# Patient Record
Sex: Female | Born: 1990 | Race: White | Hispanic: No | Marital: Single
Health system: Southern US, Community
[De-identification: ages and names within clinical notes are randomized; demographics above are authoritative.]

---

## 2013-04-06 ENCOUNTER — Emergency Department: Payer: Self-pay | Admitting: Emergency Medicine

## 2013-04-06 LAB — COMPREHENSIVE METABOLIC PANEL
ALK PHOS: 56 U/L
ALT: 19 U/L (ref 12–78)
ANION GAP: 6 — AB (ref 7–16)
Albumin: 3.8 g/dL (ref 3.4–5.0)
BILIRUBIN TOTAL: 0.3 mg/dL (ref 0.2–1.0)
BUN: 7 mg/dL (ref 7–18)
CREATININE: 0.71 mg/dL (ref 0.60–1.30)
Calcium, Total: 9 mg/dL (ref 8.5–10.1)
Chloride: 107 mmol/L (ref 98–107)
Co2: 24 mmol/L (ref 21–32)
GLUCOSE: 84 mg/dL (ref 65–99)
OSMOLALITY: 271 (ref 275–301)
Potassium: 3.4 mmol/L — ABNORMAL LOW (ref 3.5–5.1)
SGOT(AST): 25 U/L (ref 15–37)
Sodium: 137 mmol/L (ref 136–145)
Total Protein: 7.6 g/dL (ref 6.4–8.2)

## 2013-04-06 LAB — CBC
HCT: 40.2 % (ref 35.0–47.0)
HGB: 13.8 g/dL (ref 12.0–16.0)
MCH: 30.7 pg (ref 26.0–34.0)
MCHC: 34.2 g/dL (ref 32.0–36.0)
MCV: 90 fL (ref 80–100)
Platelet: 153 10*3/uL (ref 150–440)
RBC: 4.48 10*6/uL (ref 3.80–5.20)
RDW: 12.5 % (ref 11.5–14.5)
WBC: 12 10*3/uL — ABNORMAL HIGH (ref 3.6–11.0)

## 2013-04-06 LAB — URINALYSIS, COMPLETE
Bilirubin,UR: NEGATIVE
Glucose,UR: NEGATIVE mg/dL (ref 0–75)
Leukocyte Esterase: NEGATIVE
Nitrite: NEGATIVE
PROTEIN: NEGATIVE
Ph: 7 (ref 4.5–8.0)
Specific Gravity: 1.01 (ref 1.003–1.030)
Squamous Epithelial: 1
WBC UR: <1 /HPF (ref 0–5)

## 2013-04-06 LAB — LIPASE, BLOOD: Lipase: 89 U/L (ref 73–393)

## 2013-04-07 ENCOUNTER — Inpatient Hospital Stay: Payer: Self-pay | Admitting: Internal Medicine

## 2013-04-07 LAB — COMPREHENSIVE METABOLIC PANEL
Albumin: 3 g/dL — ABNORMAL LOW (ref 3.4–5.0)
Alkaline Phosphatase: 46 U/L
Anion Gap: 7 (ref 7–16)
BUN: 4 mg/dL — ABNORMAL LOW (ref 7–18)
Bilirubin,Total: 0.3 mg/dL (ref 0.2–1.0)
CHLORIDE: 107 mmol/L (ref 98–107)
CREATININE: 0.59 mg/dL — AB (ref 0.60–1.30)
Calcium, Total: 7.5 mg/dL — ABNORMAL LOW (ref 8.5–10.1)
Co2: 23 mmol/L (ref 21–32)
EGFR (Non-African Amer.): >60
GLUCOSE: 123 mg/dL — AB (ref 65–99)
OSMOLALITY: 272 (ref 275–301)
Potassium: 3.3 mmol/L — ABNORMAL LOW (ref 3.5–5.1)
SGOT(AST): 23 U/L (ref 15–37)
SGPT (ALT): 19 U/L (ref 12–78)
Sodium: 137 mmol/L (ref 136–145)
Total Protein: 6.2 g/dL — ABNORMAL LOW (ref 6.4–8.2)

## 2013-04-07 LAB — CBC WITH DIFFERENTIAL/PLATELET
BASOS ABS: 0 10*3/uL (ref 0.0–0.1)
Basophil %: 0.1 %
EOS ABS: 0 10*3/uL (ref 0.0–0.7)
Eosinophil %: 0 %
HCT: 34.5 % — ABNORMAL LOW (ref 35.0–47.0)
HGB: 11.7 g/dL — ABNORMAL LOW (ref 12.0–16.0)
Lymphocyte #: 0.8 10*3/uL — ABNORMAL LOW (ref 1.0–3.6)
Lymphocyte %: 7.7 %
MCH: 30.4 pg (ref 26.0–34.0)
MCHC: 33.9 g/dL (ref 32.0–36.0)
MCV: 90 fL (ref 80–100)
Monocyte #: 1.1 x10 3/mm — ABNORMAL HIGH (ref 0.2–0.9)
Monocyte %: 10.2 %
NEUTROS PCT: 82 %
Neutrophil #: 8.9 10*3/uL — ABNORMAL HIGH (ref 1.4–6.5)
PLATELETS: 147 10*3/uL — AB (ref 150–440)
RBC: 3.85 10*6/uL (ref 3.80–5.20)
RDW: 12.5 % (ref 11.5–14.5)
WBC: 10.8 10*3/uL (ref 3.6–11.0)

## 2013-04-07 LAB — LIPASE, BLOOD: Lipase: 111 U/L (ref 73–393)

## 2013-04-07 LAB — RAPID INFLUENZA A&B ANTIGENS

## 2013-04-07 LAB — MONONUCLEOSIS SCREEN: Mono Test: NEGATIVE

## 2013-04-09 LAB — BETA STREP CULTURE(ARMC)

## 2013-04-12 LAB — CULTURE, BLOOD (SINGLE)

## 2014-01-27 ENCOUNTER — Emergency Department: Payer: Self-pay | Admitting: Emergency Medicine

## 2014-07-06 NOTE — Discharge Summary (Signed)
PATIENT NAME:  Leslie Pena, Leslie Pena MR#:  [id number] DATE OF BIRTH:  Oct 09, 1990  DATE OF ADMISSION:  04/07/2013  DATE OF DISCHARGE:  04/07/2013  DISCHARGE DIAGNOSES:  1.   Influenza A. 2.   Nausea, vomiting.  DISCHARGE MEDICATIONS INCLUDE:   1.  Zofran 4 mg oral disintegrating 3 times a day as needed for nausea, vomiting. 2.  Tamiflu suspension 75 mg oral every 12 hours for 4 more days. 3.  Tylenol 325 mg 2 tablets every 4 hours as needed for pain and temperature.   ADMITTING HISTORY AND PHYSICAL AND HOSPITAL COURSE: Please see detailed H and P dictated previously. In brief, a 24 year old Caucasian female patient with no significant past medical history, presented to the hospital complaining of nausea, vomiting, sore throat. Was found to have influenza A positive. Unable to keep her medications or any food or fluids down. Was admitted with dehydration, intractable nausea, vomiting. The patient improved well with 2 doses of Tamiflu during the hospital stay. On IV fluids, has felt better. Lungs sound clear on examination prior to discharge, and patient is being discharged back home, to follow up with her primary care physician.   DISCHARGE INSTRUCTIONS:  Regular diet. Activity as tolerated.   Time spent on discharge in discharge activity was 35 minutes.   ____________________________ Molinda BailiffSrikar R. Farooq Petrovich, MD srs:mr D: 04/08/2013 15:12:11 ET Pena: 04/08/2013 20:44:08 ET JOB#: [id number]  cc: Wardell HeathSrikar R. Samariyah Cowles, MD, <Dictator> Orie FishermanSRIKAR R Sebastian Lurz MD ELECTRONICALLY SIGNED 04/10/2013 10:20

## 2014-07-06 NOTE — H&P (Signed)
PATIENT NAME:  Leslie Pena, [name] MR#:  [id number] DATE OF BIRTH:  [birth year]  DATE OF ADMISSION:  04/07/2013  PRIMARY CARE PHYSICIAN: Nonlocal.   REFERRING PHYSICIAN: Dr. Dolores FrameSung.   CHIEF COMPLAINT: Intractable nausea, vomiting, abdominal pain and sore throat.   HISTORY OF PRESENT ILLNESS: The patient is a 24 year old pleasant Caucasian female with no past medical history, presenting to the ER with a chief complaint of intractable nausea, vomiting, epigastric abdominal pain and fever. The patient is reporting that for the past 3 days, she has been experiencing sore throat. She is also experiencing epigastric abdominal pain associated with intractable nausea and vomiting. She spiked a fever of 102.5 degrees Fahrenheit yesterday and came into the ER. The patient was diagnosed with influenza A and she was sent home with Tamiflu. The patient again comes back to the ER with a temperature of 101.7 degrees Fahrenheit and reported that she was unable to keep anything down and she is feeling weak and tired. The patient was given IV fluids, and hospitalist team is contacted to admit the patient for intractable nausea and vomiting. During my examination, the patient looks weak and tired. She is concerned that she was unable to keep any foods or liquids down. Her boyfriend is at bedside. Denies any chest pain, shortness of breath. Denies any dizziness.   PAST MEDICAL HISTORY: None.   PAST SURGICAL HISTORY: None.   ALLERGIES: NO KNOWN DRUG ALLERGIES.   PSYCHOSOCIAL HISTORY: [facility] [employer]. No history of smoking, occasional intake of alcohol. Denies drugs. She is a Brewing technologistpsychology student.   HOME MEDICATIONS: Birth control pills.   FAMILY HISTORY: Dad has hyperlipidemia.   REVIEW OF SYSTEMS: CONSTITUTIONAL:  Denies any fatigue, but complaining of weakness and fever.  EYES: Denies blurry vision, double vision.  ENT: Denies epistaxis, discharge. Complaining of throat pain.  RESPIRATORY:  Complaining of cough, no COPD. CARDIOVASCULAR: No chest pain, palpitations. GASTROINTESTINAL: Intractable nausea, vomiting and complaining of epigastric abdominal pain. Denies hematemesis.  GENITOURINARY: No dysuria or hematuria.  ENDOCRINE: Denies polyuria, nocturia or thyroid problems.  HEMATOLOGIC AND LYMPHATIC: No anemia, easy bruising, bleeding.  INTEGUMENT: No acne, rash, lesions.  MUSCULOSKELETAL: No joint pain in the neck and back.  NEUROLOGIC:  Denies any vertigo or ataxia. PSYCHIATRIC: No ADD, OCD.   PHYSICAL EXAMINATION:  VITAL SIGNS: Temperature 98.5, pulse 121, respirations 18, blood pressure 109/61, pulse oximetry 97%.  GENERAL APPEARANCE: Not in acute distress. Moderately built, thin-looking Caucasian female, looking weak and tired.  HEENT: Normocephalic, atraumatic. Pupils are equally reactive to light and accommodation. No scleral icterus. No conjunctival injection. No sinus tenderness. Nasal congestion. Oral cavity: Throat is erythematous. No plaques were noticed. Uvula is midline. No postnasal drip.  NECK: Supple. No JVD. No thyromegaly. Range of motion is intact.  LUNGS: Clear to auscultation bilaterally. No accessory muscle use and no anterior chest wall tenderness on palpation.  CARDIAC: S1, S2 normal. Regular rate and rhythm, tachycardic.  GASTROINTESTINAL: Soft. Bowel sounds are positive in all four quadrants. Nontender, nondistended, except for epigastric discomfort. No rebound tenderness. No masses felt.  NEUROLOGIC: Awake, alert, oriented x3. Motor and sensory grossly intact. Reflexes are 2+.  EXTREMITIES: No edema. No cyanosis. No clubbing.  SKIN: Warm to touch. Dry in nature. Decreased turgor. No rashes. No lesions.  MUSCULOSKELETAL: No joint effusion, tenderness or erythema.  PSYCHIATRIC: Normal mood and affect.   LABORATORY AND IMAGING STUDIES: Nasal swab is positive for influenza A. Strep test is negative. Mono test is negative. CAT  scan of the abdomen and  pelvis with contrast revealed no acute abnormality. The appendix is unremarkable in appearance. Urine pregnancy test is negative. Accu-Chek 85. Chem-8: Glucose 123, BUN 4, creatinine 0.5, sodium 137, potassium 3.3, chloride 107, CO2 was 23. GFR greater than 60. Anion gap 7, serum osmolality 272, calcium 7.5, lipase is 111. LFTs: Albumin is low at 3.0, total protein 6.0. The rest of the LFTs are normal. WBC 10.8, hemoglobin 11.7, hematocrit 34.5, platelets are 147. Chest x-ray, PA and lateral views: pleural thickening, no pneumonia.  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 Will be admitted with following assessment and plan.   1.  Intractable nausea and vomiting with epigastric abdominal pain. Will admit her to medical floor. Will keep her n.p.o., provide her aggressive hydration with IV fluids.  2.  Acute viral illness with influenza A. The patient is placed on Tamiflu 75 mg p.o. b.i.d. Will give her Tylenol on an as-needed basis.  3.  Fever with sore throat from influenza A. We will provide her Tylenol and Tamiflu.  4.  The patient is ambulatory. We will provide aggressive hydration. Deep venous thrombosis prophylaxis is not needed, as the patient is ambulatory.  5.  Gastrointestinal prophylaxis with Protonix.   CODE STATUS: She is full code. Parents are medical power of attorney.   Diagnosis and plan of care were discussed in detail with the patient and her boyfriend at bedside. They both verbalized understanding of the plan.   Total time spent on admission is 45 minutes.   ____________________________ Ramonita Lab, MD ag:cg D: 04/07/2013 04:30:34 ET T: 04/07/2013 05:36:31 ET JOB#: [id number]  cc: Ramonita Lab, MD, <Dictator> Ramonita Lab MD ELECTRONICALLY SIGNED 04/20/2013 7:35

## 2015-07-16 IMAGING — CR DG CHEST 2V
1 series · 2 of 2 positions shown · non-contrast
Comparison: None.

CLINICAL DATA: Fever and cough.

EXAM:
CHEST  2 VIEW

[Series 1: w chest pa · 0.14mm/px · 2 of 2 slices shown]
[im 1/2]
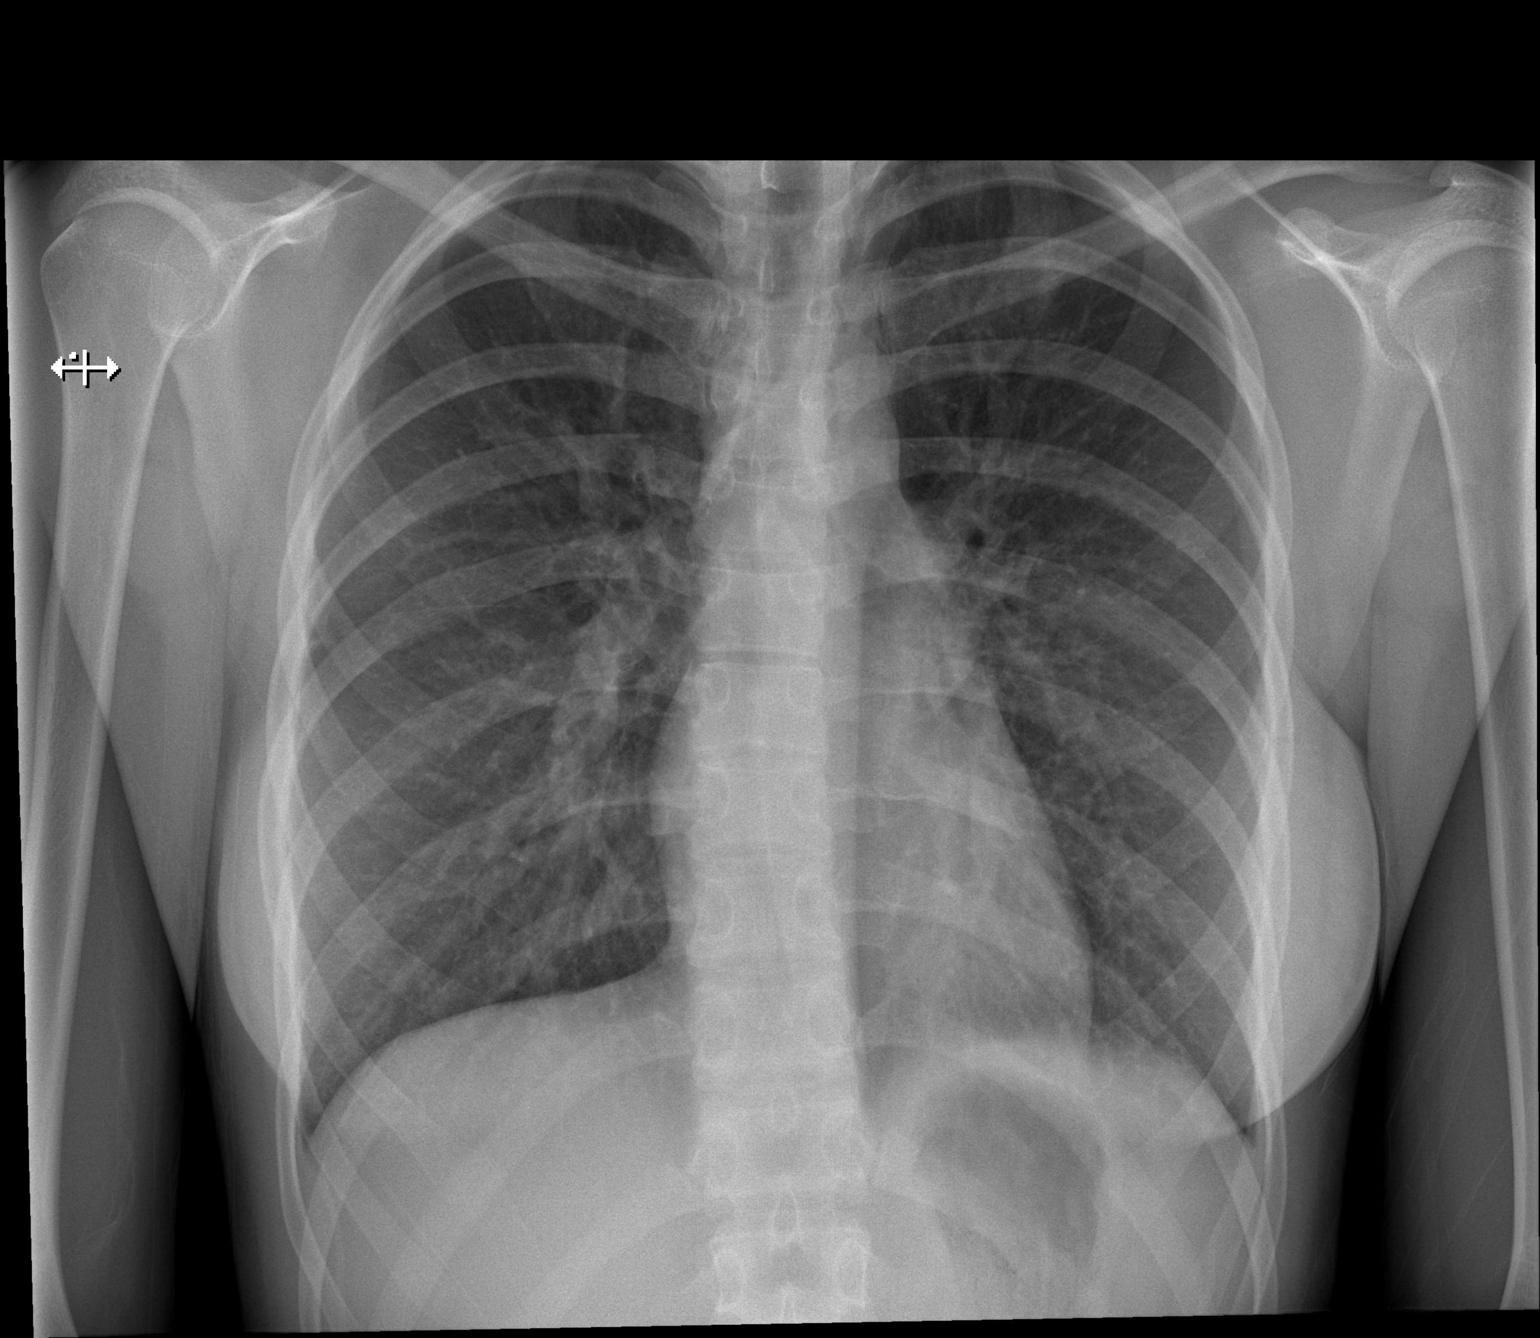
[im 2/2]
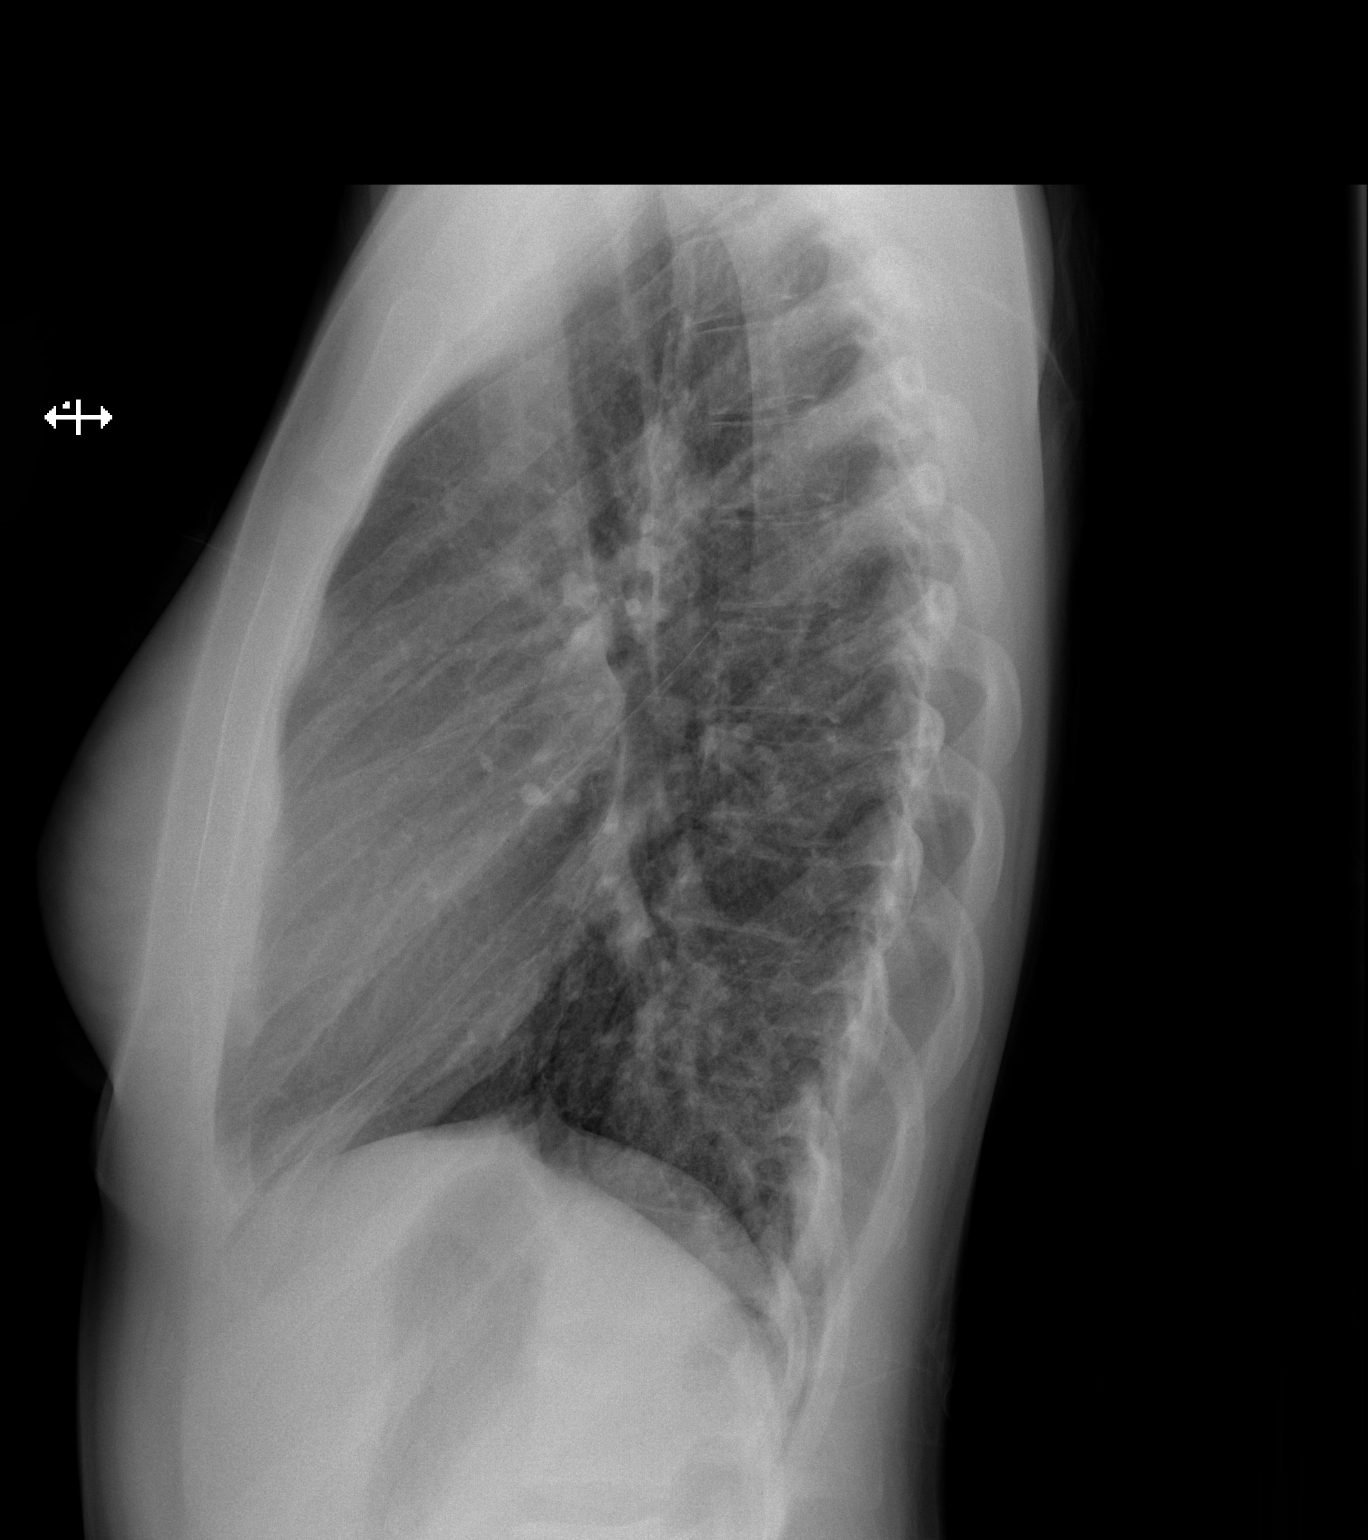

[2 of 2 positions shown; findings below may reference images not displayed]

FINDINGS: The heart size and mediastinal contours are within normal limits.
Central airway thickening is noted bilaterally. The visualized
skeletal structures are unremarkable.
IMPRESSION: 1. Central airway thickening.
2. No pneumonia.

## 2015-07-16 IMAGING — CT CT ABD-PELV W/ CM
2 of 4 series · 17 of 46 positions shown, 19 images · IV contrast (isovue)
Comparison: None.

CLINICAL DATA: Periumbilical abdominal pain and fever. Nausea and
vomiting.

EXAM:
CT ABDOMEN AND PELVIS WITH CONTRAST
TECHNIQUE: Multidetector CT imaging of the abdomen and pelvis was performed
using the standard protocol following bolus administration of
intravenous contrast.
CONTRAST:  80 mL of Isovue 370 IV contrast

[Series 2: routine abd pel with · axial · 0.56mm/px · z∈[-428,-38]mm · 14 of 86 slices shown, 16 images]
[im 4/86  soft-tissue]
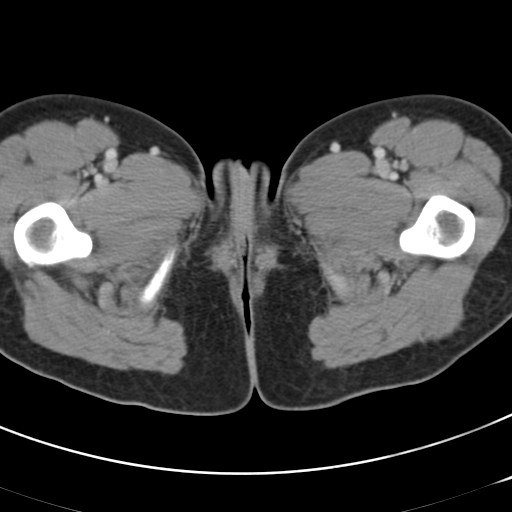
[im 4/86  bone]
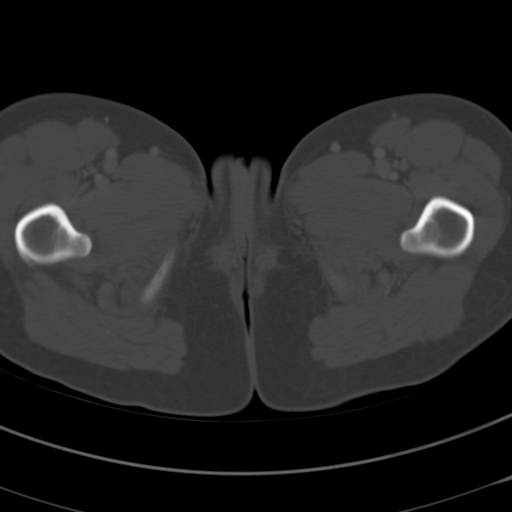
[im 11/86  soft-tissue]
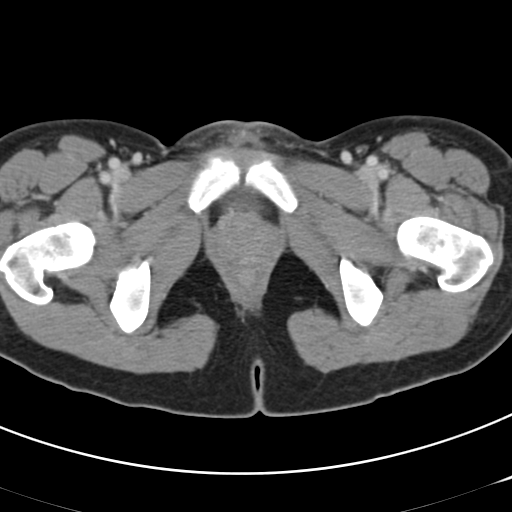
[im 18/86  soft-tissue]
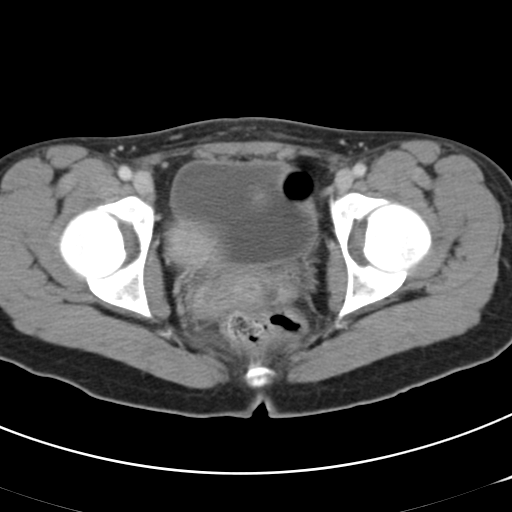
[im 24/86  soft-tissue]
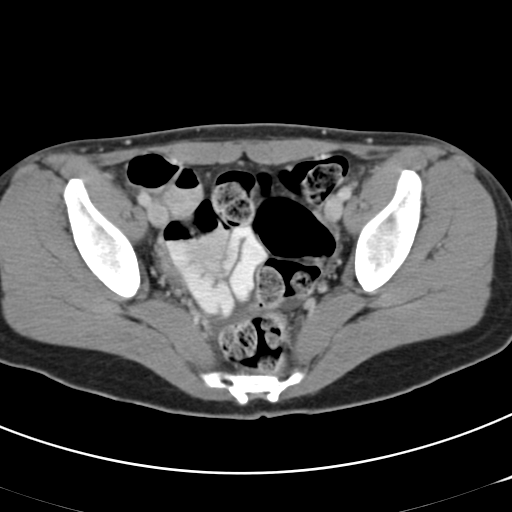
[im 28/86  soft-tissue]
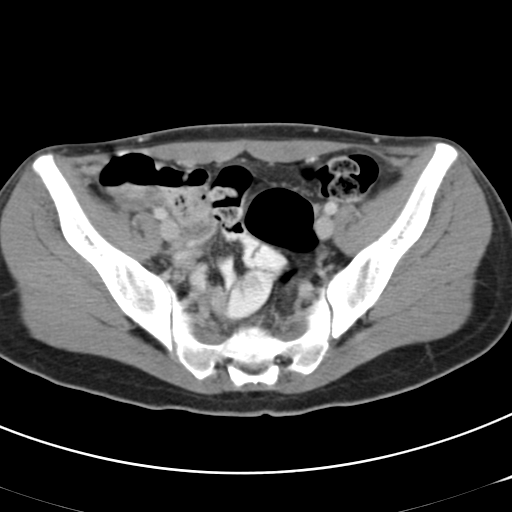
[im 35/86  soft-tissue]
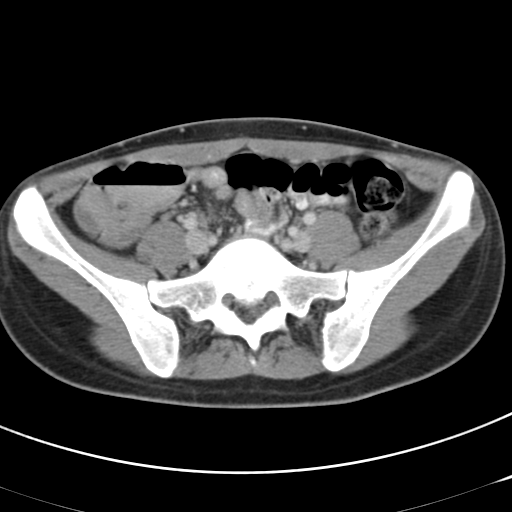
[im 41/86  soft-tissue]
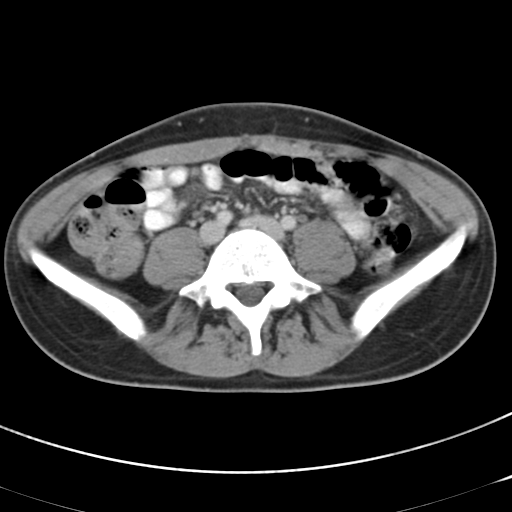
[im 45/86  soft-tissue]
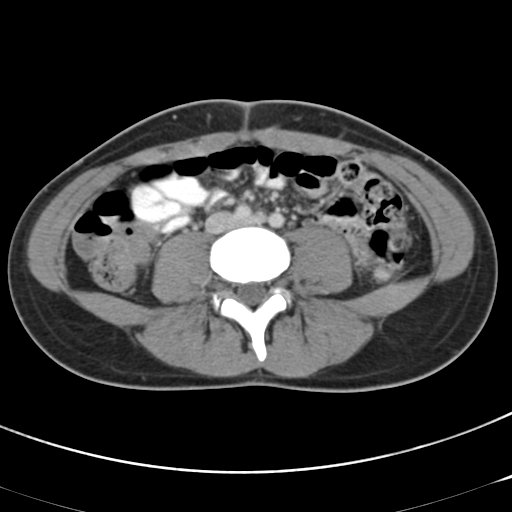
[im 52/86  soft-tissue]
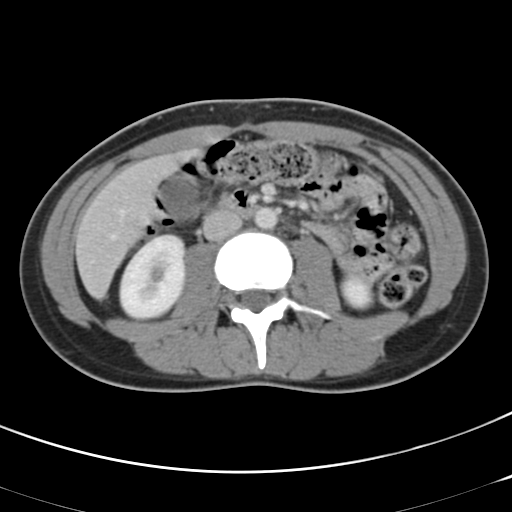
[im 52/86  bone]
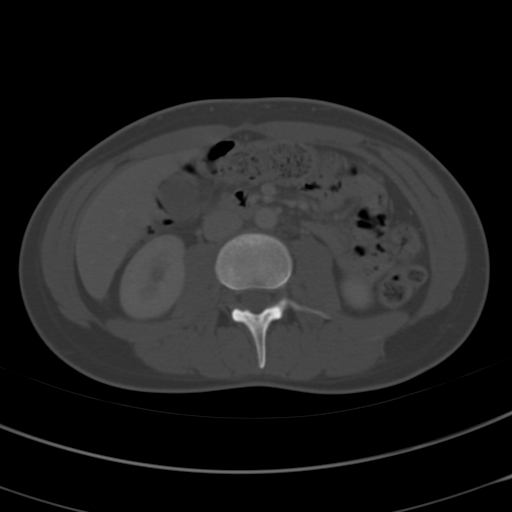
[im 58/86  soft-tissue]
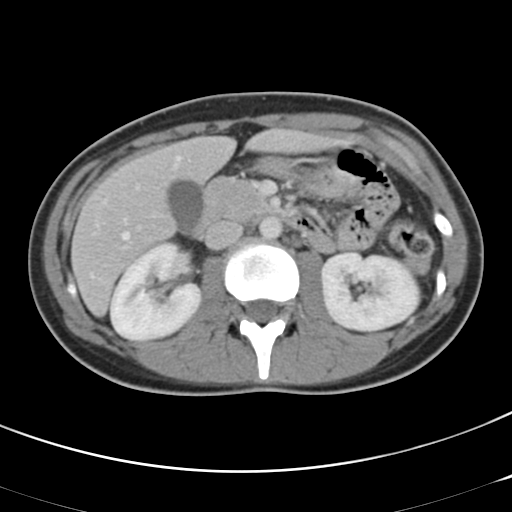
[im 65/86  soft-tissue]
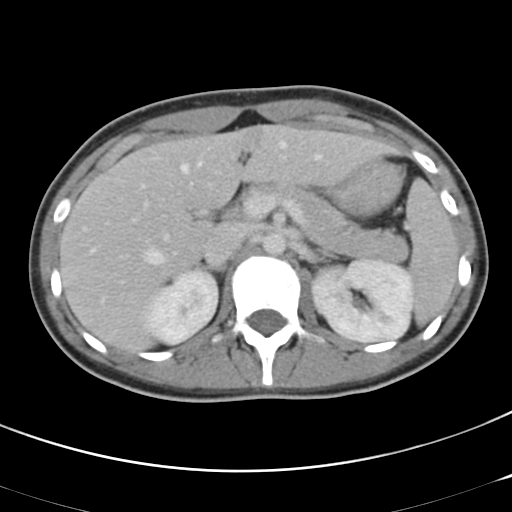
[im 69/86  soft-tissue]
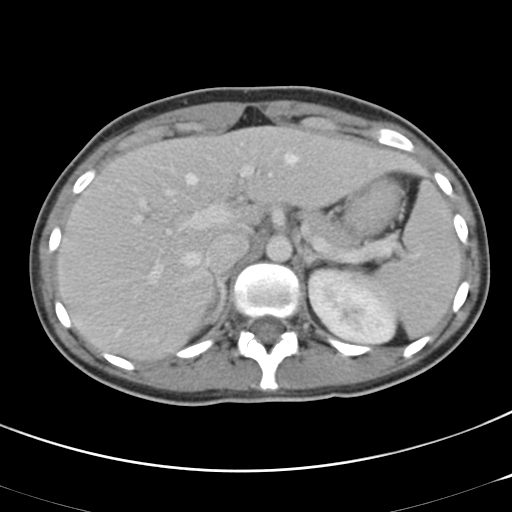
[im 75/86  soft-tissue]
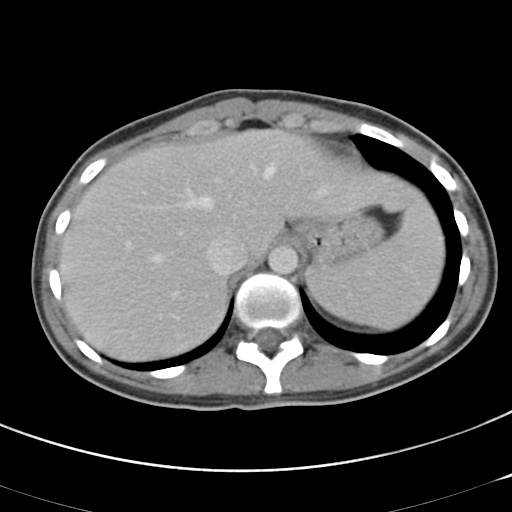
[im 82/86  soft-tissue]
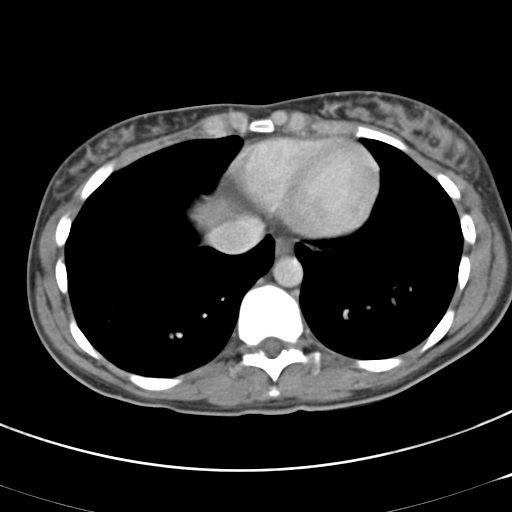

[Series 5: cor routine abd pel with · coronal · 0.89mm/px · 3 of 91 slices shown]
[im 31/91  soft-tissue]
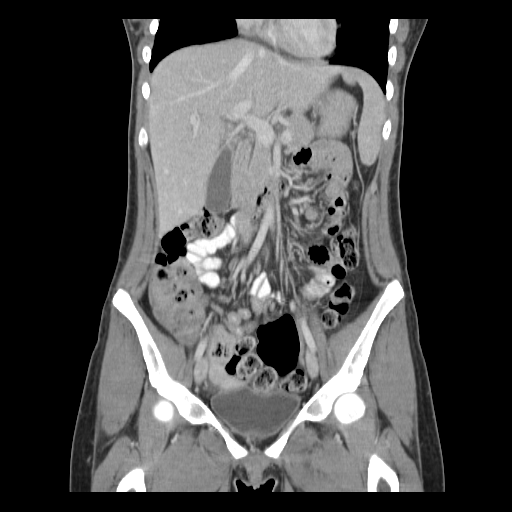
[im 41/91  soft-tissue]
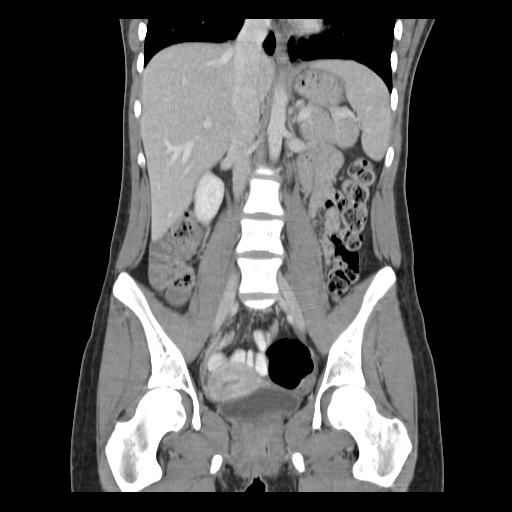
[im 51/91  soft-tissue]
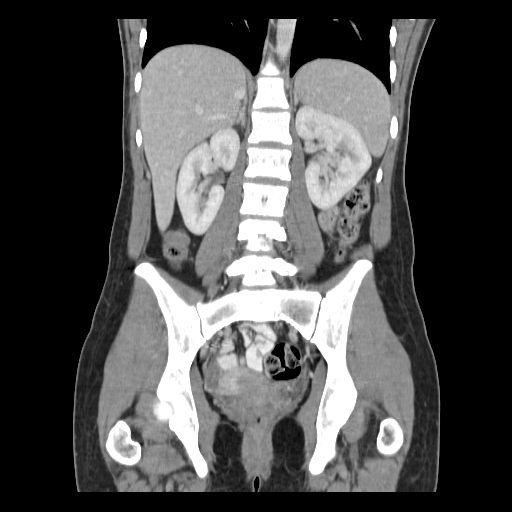

[17 of 46 positions shown; findings below may reference images not displayed]

FINDINGS: The visualized lung bases are clear.

The liver and spleen are unremarkable in appearance. The gallbladder
is within normal limits. The pancreas and adrenal glands are
unremarkable.

The kidneys are unremarkable in appearance. There is no evidence of
hydronephrosis. No renal or ureteral stones are seen. No perinephric
stranding is appreciated.

The small bowel is unremarkable in appearance. The stomach is within
normal limits. No acute vascular abnormalities are seen.

The appendix is normal in caliber, best seen on coronal images,
without evidence for appendicitis. The colon is unremarkable in
appearance.

The bladder is mildly distended and grossly unremarkable in
appearance. The uterus is within normal limits. The ovaries are
grossly unremarkable. No suspicious adnexal masses are seen. Trace
free fluid within the pelvis is likely physiologic in nature No
inguinal lymphadenopathy is seen.

No acute osseous abnormalities are identified.
IMPRESSION: No acute abnormality seen to explain the patient's symptoms. The
appendix is unremarkable in appearance.
